# Patient Record
Sex: Male | Born: 1980 | Race: White | Hispanic: No | Marital: Single | Smoking: Never smoker
Health system: Southern US, Community
[De-identification: ages and names within clinical notes are randomized; demographics above are authoritative.]

## PROBLEM LIST (undated history)

## (undated) DIAGNOSIS — K589 Irritable bowel syndrome without diarrhea: Secondary | ICD-10-CM

## (undated) DIAGNOSIS — R350 Frequency of micturition: Secondary | ICD-10-CM

## (undated) DIAGNOSIS — R351 Nocturia: Secondary | ICD-10-CM

## (undated) HISTORY — DX: Irritable bowel syndrome without diarrhea: K58.9

## (undated) HISTORY — DX: Frequency of micturition: R35.0

## (undated) HISTORY — PX: TONSILLECTOMY: SUR1361

## (undated) HISTORY — DX: Nocturia: R35.1

## (undated) HISTORY — PX: HERNIA REPAIR: SHX51

## (undated) HISTORY — PX: TYMPANOSTOMY TUBE PLACEMENT: SHX32

---

## 2011-12-24 ENCOUNTER — Emergency Department (HOSPITAL_COMMUNITY)
Admission: EM | Admit: 2011-12-24 | Discharge: 2011-12-24 | Disposition: A | Discharge status: Home / Self Care | Payer: BC Managed Care – PPO | Attending: Emergency Medicine | Admitting: Emergency Medicine

## 2011-12-24 ENCOUNTER — Emergency Department (HOSPITAL_COMMUNITY): Payer: BC Managed Care – PPO

## 2011-12-24 ENCOUNTER — Encounter (HOSPITAL_COMMUNITY): Payer: Self-pay

## 2011-12-24 DIAGNOSIS — R109 Unspecified abdominal pain: Secondary | ICD-10-CM

## 2011-12-24 LAB — URINALYSIS, ROUTINE W REFLEX MICROSCOPIC
Bilirubin Urine: NEGATIVE
Glucose, UA: NEGATIVE mg/dL
Hgb urine dipstick: NEGATIVE
Ketones, ur: NEGATIVE mg/dL
Leukocytes, UA: NEGATIVE
Nitrite: NEGATIVE
Protein, ur: NEGATIVE mg/dL
Specific Gravity, Urine: 1.023 (ref 1.005–1.030)
Urobilinogen, UA: 0.2 mg/dL (ref 0.0–1.0)
pH: 7 (ref 5.0–8.0)

## 2011-12-24 LAB — CBC WITH DIFFERENTIAL/PLATELET
Basophils Absolute: 0 10*3/uL (ref 0.0–0.1)
Basophils Relative: 1 % (ref 0–1)
Eosinophils Absolute: 0.1 10*3/uL (ref 0.0–0.7)
Eosinophils Relative: 1 % (ref 0–5)
HCT: 43.2 % (ref 39.0–52.0)
Hemoglobin: 15.9 g/dL (ref 13.0–17.0)
Lymphocytes Relative: 34 % (ref 12–46)
Lymphs Abs: 2.5 10*3/uL (ref 0.7–4.0)
MCH: 30.5 pg (ref 26.0–34.0)
MCHC: 36.8 g/dL — ABNORMAL HIGH (ref 30.0–36.0)
MCV: 82.8 fL (ref 78.0–100.0)
Monocytes Absolute: 0.5 10*3/uL (ref 0.1–1.0)
Monocytes Relative: 7 % (ref 3–12)
Neutro Abs: 4.1 10*3/uL (ref 1.7–7.7)
Neutrophils Relative %: 57 % (ref 43–77)
Platelets: 185 10*3/uL (ref 150–400)
RBC: 5.22 MIL/uL (ref 4.22–5.81)
RDW: 12.2 % (ref 11.5–15.5)
WBC: 7.2 10*3/uL (ref 4.0–10.5)

## 2011-12-24 LAB — POCT I-STAT, CHEM 8
BUN: 10 mg/dL (ref 6–23)
Calcium, Ion: 1.24 mmol/L (ref 1.12–1.32)
Chloride: 102 mEq/L (ref 96–112)
HCT: 46 % (ref 39.0–52.0)
Sodium: 141 mEq/L (ref 135–145)

## 2011-12-24 MED ORDER — DICYCLOMINE HCL 10 MG PO CAPS
10.0000 mg | ORAL_CAPSULE | Freq: Once | ORAL | Status: AC
  Administered 2011-12-24: 10 mg via ORAL
  Filled 2011-12-24: qty 1

## 2011-12-24 MED ORDER — OMEPRAZOLE 20 MG PO CPDR: 20.0000 mg | DELAYED_RELEASE_CAPSULE | Freq: Every day | ORAL | Status: AC

## 2011-12-24 NOTE — ED Notes (Signed)
EDP at bedside  

## 2011-12-24 NOTE — ED Provider Notes (Signed)
History     CSN: [id number]  Arrival date & time 12/24/11  1312   First MD Initiated Contact with Patient 12/24/11 1917      Chief Complaint  Patient presents with  . Abdominal Pain    (Consider location/radiation/quality/duration/timing/severity/associated sxs/prior treatment) Patient is a 31 y.o. male presenting with abdominal pain. The history is provided by the patient.  Abdominal Pain The primary symptoms of the illness include abdominal pain. The primary symptoms of the illness do not include fever, shortness of breath, nausea, vomiting, diarrhea or dysuria. The current episode started more than 2 days ago. The problem has not changed since onset. The abdominal pain is located in the RLQ and LLQ. Pain scale: moderate. The abdominal pain is relieved by nothing.  The illness is associated with laxative use. The patient has had a change in bowel habit. Symptoms associated with the illness do not include chills, anorexia, constipation, urgency, hematuria, frequency or back pain.    History reviewed. No pertinent past medical history.  Past Surgical History  Procedure Date  . Hernia repair   . Tonsillectomy     No family history on file.  History  Substance Use Topics  . Smoking status: Never Smoker   . Smokeless tobacco: Not on file  . Alcohol Use: Yes      Review of Systems  Unable to perform ROS Constitutional: Negative for fever and chills.  Respiratory: Negative for cough and shortness of breath.   Cardiovascular: Negative for chest pain and palpitations.  Gastrointestinal: Positive for abdominal pain. Negative for nausea, vomiting, diarrhea, constipation, blood in stool, abdominal distention and anorexia.  Genitourinary: Negative for dysuria, urgency, frequency and hematuria.  Musculoskeletal: Negative for myalgias, back pain and arthralgias.  Skin: Negative for color change and rash.  Neurological: Negative for light-headedness and headaches.  All other  systems reviewed and are negative.    Allergies  Review of patient's allergies indicates no known allergies.  Home Medications   Current Outpatient Rx  Name Route Sig Dispense Refill  . ANIMAL SHAPES WITH C & FA PO CHEW Oral Chew 1 tablet by mouth daily.    Marland Kitchen ALIGN 4 MG PO CAPS Oral Take 1 capsule by mouth daily.    Marland Kitchen OMEPRAZOLE 20 MG PO CPDR Oral Take 1 capsule (20 mg total) by mouth daily. 30 capsule 0    BP 154/98  Pulse 60  Temp 98.9 F (37.2 C) (Oral)  Resp 20  SpO2 100%  Physical Exam  Nursing note and vitals reviewed. Constitutional: He is oriented to person, place, and time. He appears well-developed and well-nourished.  HENT:  Head: Normocephalic and atraumatic.  Eyes: EOM are normal. Pupils are equal, round, and reactive to light.  Cardiovascular: Normal rate and regular rhythm.   Pulmonary/Chest: Effort normal and breath sounds normal. No respiratory distress.  Abdominal: Soft. Bowel sounds are normal. He exhibits no distension. There is tenderness (mild, RLQ).  Genitourinary: Rectum normal.       No impaction  Neurological: He is alert and oriented to person, place, and time.  Skin: Skin is warm and dry.  Psychiatric: He has a normal mood and affect.    ED Course  Procedures (including critical care time)  Labs Reviewed  CBC WITH DIFFERENTIAL - Abnormal; Notable for the following:    MCHC 36.8 (*)     All other components within normal limits  URINALYSIS, ROUTINE W REFLEX MICROSCOPIC  POCT I-STAT, CHEM 8   Dg Abd Acute W/chest  12/24/2011  *RADIOLOGY REPORT*  Clinical Data: Abdominal pain for 1 week, nausea, vomiting, prior hernia surgery  ACUTE ABDOMEN SERIES (ABDOMEN 2 VIEW & CHEST 1 VIEW)  Comparison: None  Findings: Upper normal heart size. Mediastinal contours and pulmonary vascularity normal. Minimal bronchitic changes without infiltrate or effusion. No pneumothorax. Nonobstructive bowel gas pattern. No bowel dilatation, bowel wall thickening or free  intraperitoneal air. Bones unremarkable. No urinary tract calcification. Question bone island left ilium.  IMPRESSION: Minimal bronchitic changes. No acute abdominal findings.  Original Report Authenticated By: Lollie Marrow, M.D.     1. Abdominal pain       MDM  This is a 31 year old male who presents with about a week's worth of abdominal pain. He was seen by his regular doctor at the end of last week, was told that he was constipated by an x-ray, and he had significant stool on the right side. He was told to drink GoLYTELY, to clear his bowels, and the patient states that he then passed yellow, bloody stools; he says he drank yellow Gatorade with this. He continued to have some pain, so started on mag citrate by his PCP, and had not any improvement, and has continued to pass small amounts of liquid stools. He did not eat much about 2 days ago due to decreased appetite, at which point he developed a headache and had vomiting times once, he has not had any other episodes of nausea or vomiting, has had a mildly decreased appetite throughout the week, but has still been drinking well. He did not have any fevers or any others symptoms. He has mild tenderness to the right lower quadrant on exam, rectal exam does not show any signs of impaction. Basic labs were obtained which were unremarkable. An acute abdominal series was also obtained, which does not show any acute findings, and has not had this significant stool burden. He'll the patient's initial pain was likely due to constipation, however the GoLYTELY was successful in clearing out his constipation, however continue use of laxatives are likely contributing to his current pain. Just with the patient stopping use of laxatives, resuming a normal diet, and close followup with his regular doctor for further evaluation. Discussed indications and worsening of his symptoms which are should return. The patient expressed understanding.      Theotis Burrow, MD 12/25/11 [phone]

## 2011-12-24 NOTE — ED Notes (Signed)
Pt presents with generalized abdominal pain x 1 week, pt seen at [facility], was told he was constipated and given multiple laxatives with only "yellow-brown liquid" for results.  +vomiting x 1 episode, pt reports no appetite.

## 2011-12-24 NOTE — ED Notes (Signed)
Pt stated that abdominal pain is worse, denies any nausea nor vomiting, VSS, skin is warm and dry, respiration is even and unlabored.

## 2011-12-24 NOTE — ED Notes (Signed)
Rx x 1, pt and friend voiced understanding to f/u with PCP and return for worsening of sx

## 2011-12-31 NOTE — ED Provider Notes (Signed)
I saw and evaluated the patient, reviewed the resident's note and I agree with the findings and plan.  30yM with abdominal pain. My exam benign. Lungs clear. RRR, w/o murmur. Well appearing. HD stable. Very los suspicion for emergent surgical process. Plan PPi or H2 blocker for possible gerd. Bentyl for spasms. Return precautions discussed. outpt fu.  Raeford Razor, MD 12/31/11 1004

## 2013-07-07 IMAGING — CR DG ABDOMEN ACUTE W/ 1V CHEST
3 series · 3 of 3 positions shown · non-contrast
Comparison: None

CLINICAL DATA: Abdominal pain for 1 week, nausea, vomiting, prior
hernia surgery

ACUTE ABDOMEN SERIES (ABDOMEN 2 VIEW & CHEST 1 VIEW)

[w chest pa]
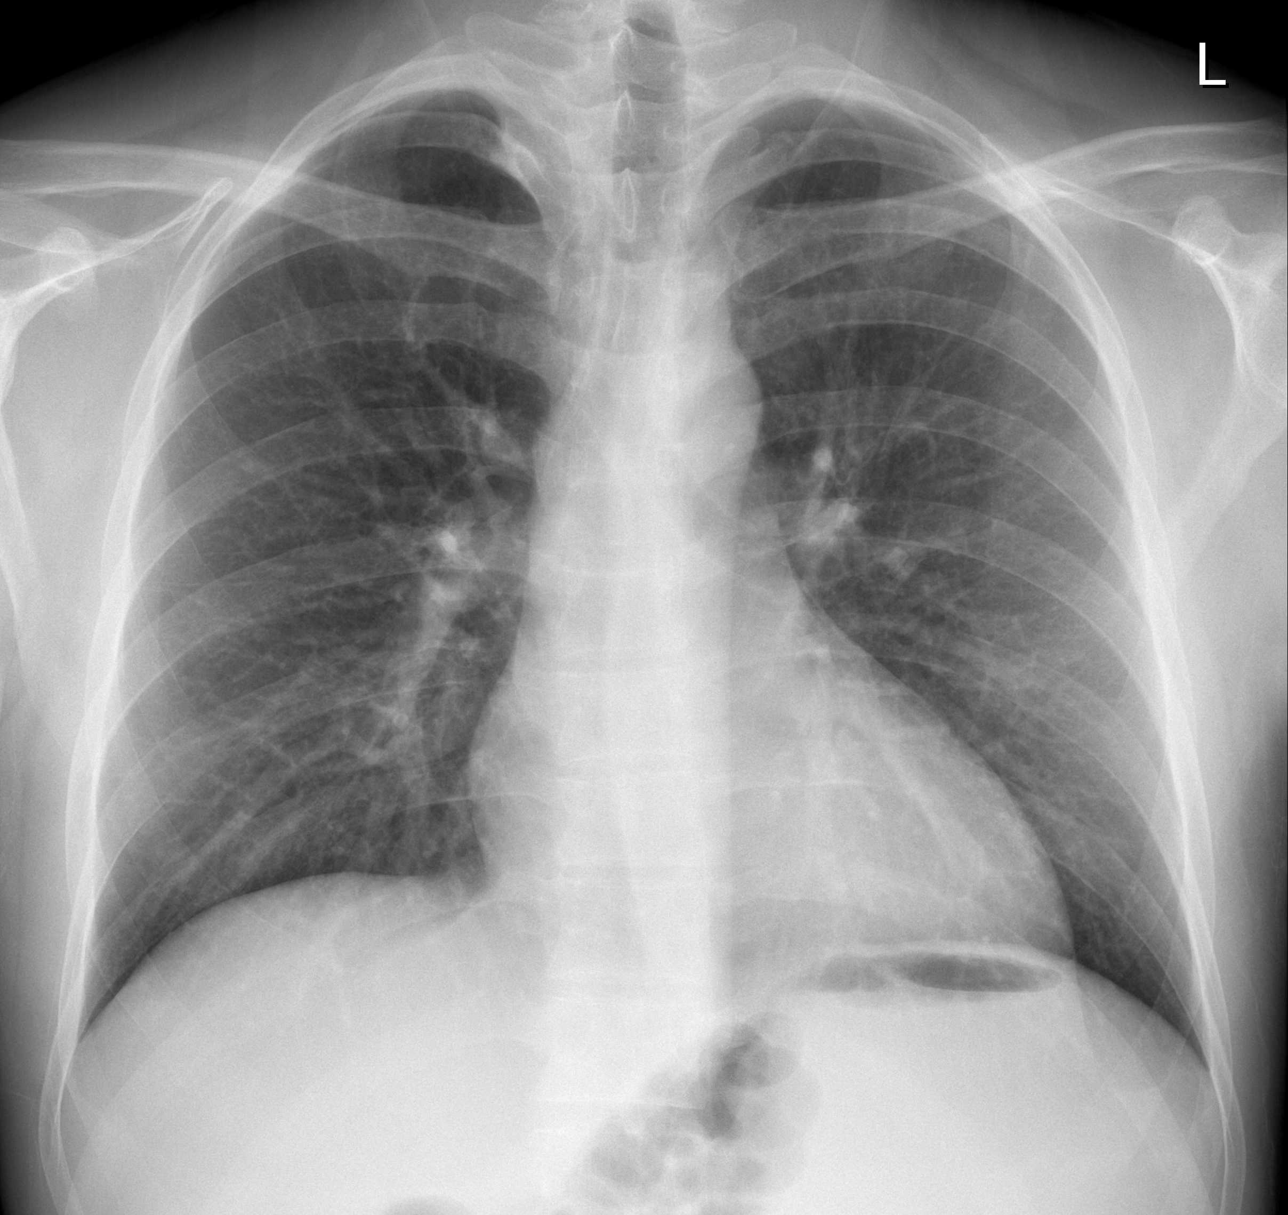

[w abdomen upright]
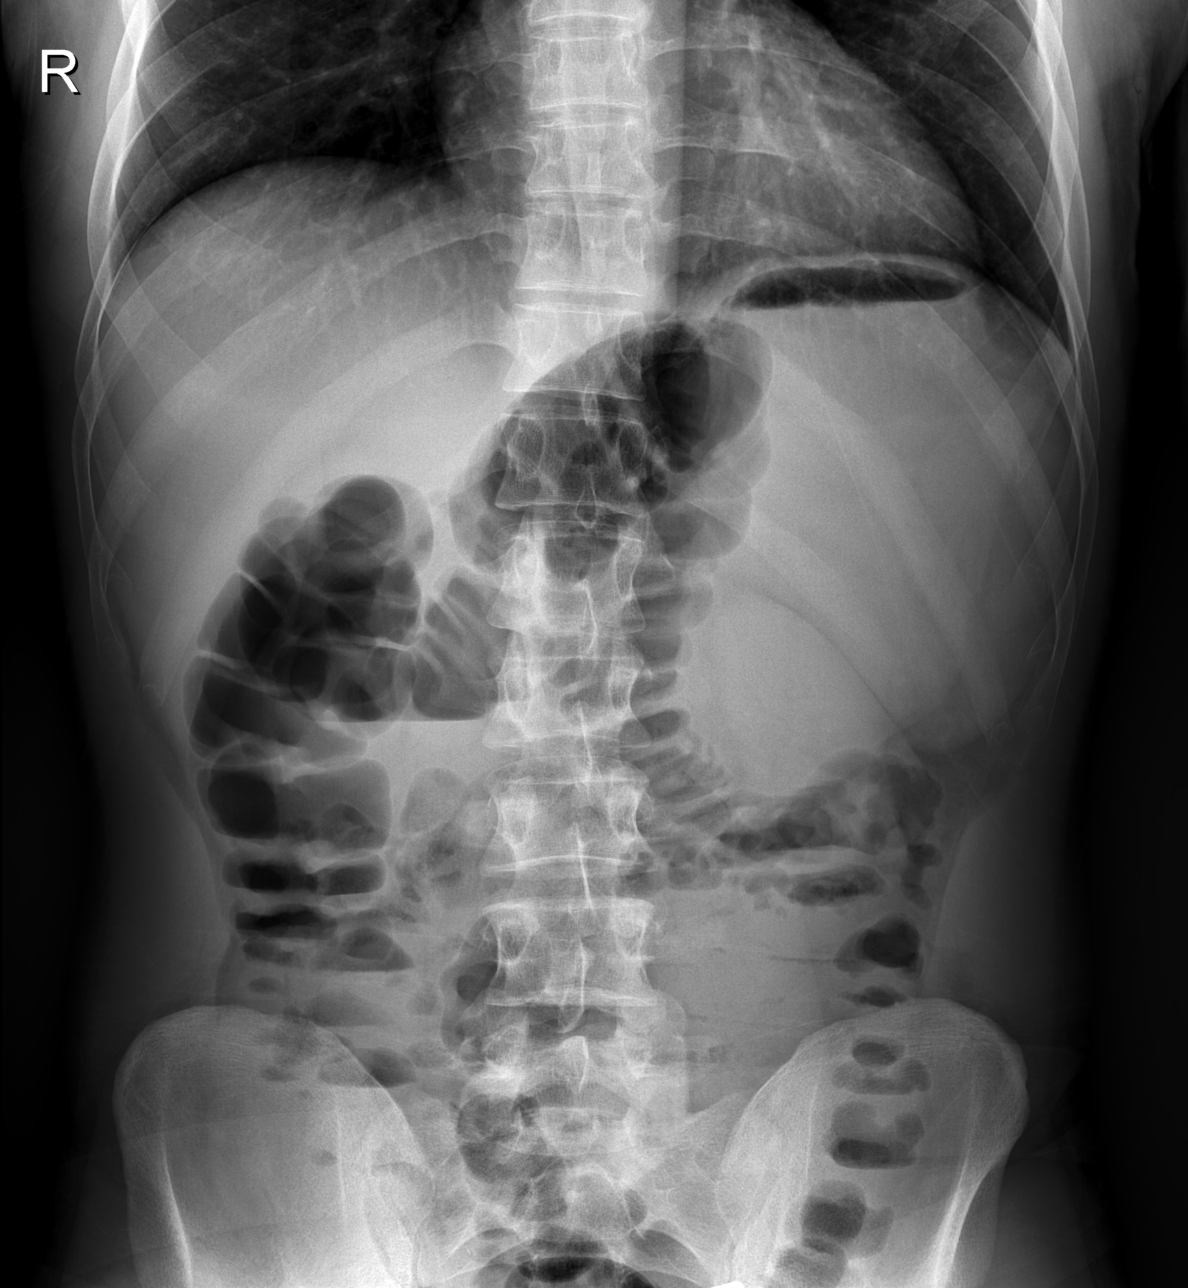

[t abdomen supine]
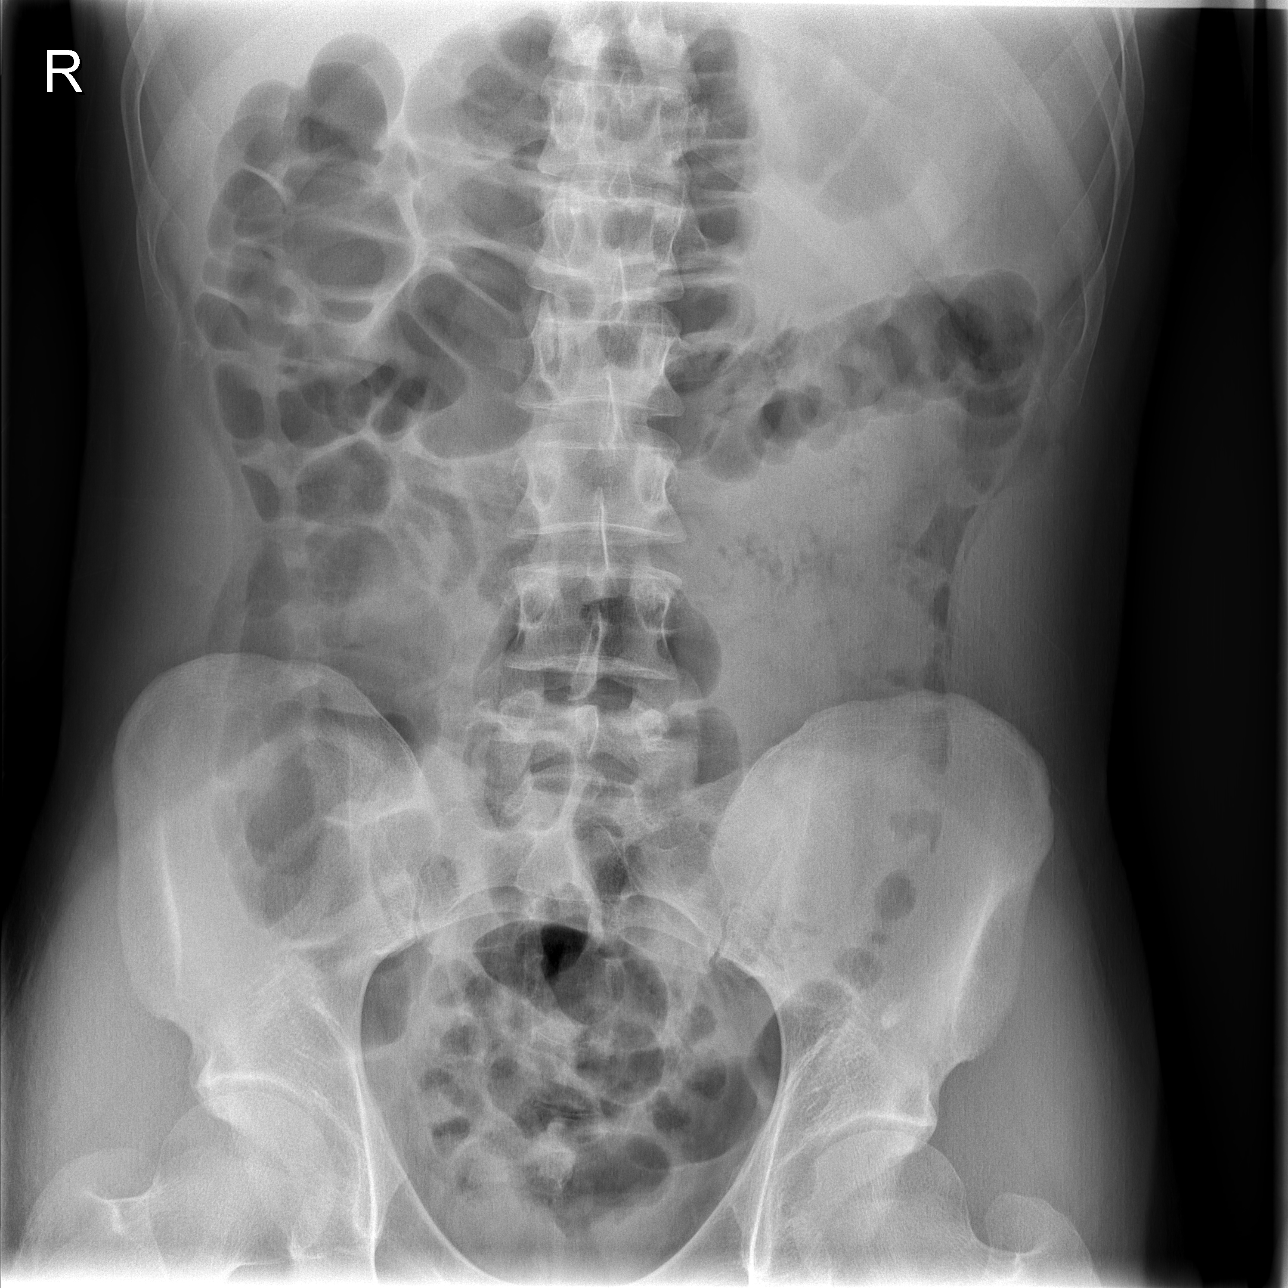

[3 of 3 positions shown; findings below may reference images not displayed]

FINDINGS: Upper normal heart size.
Mediastinal contours and pulmonary vascularity normal.
Minimal bronchitic changes without infiltrate or effusion.
No pneumothorax.
Nonobstructive bowel gas pattern.
No bowel dilatation, bowel wall thickening or free intraperitoneal
air.
Bones unremarkable.
No urinary tract calcification.
Question bone island left ilium.
IMPRESSION: Minimal bronchitic changes.
No acute abdominal findings.

## 2016-07-11 NOTE — Progress Notes (Deleted)
  Tawana ScaleZach Landan Fedie D.O. [facility] [address] [place], [place] [address] Phone: [phone] Subjective:    I'm seeing this patient by the request  of:  Default, Provider, MD   CC: Musculoskeletal chest pain  BJY:[id number]:Subjective  James HoltsMichael Richmond is a 36 y.o. male coming in with complaint of chest discomfort.     Past Medical History:  Diagnosis Date  . IBS (irritable bowel syndrome)   . Nocturia   . Urinary frequency    Past Surgical History:  Procedure Laterality Date  . HERNIA REPAIR    . TONSILLECTOMY    . TYMPANOSTOMY TUBE PLACEMENT     Social History   Social History  . Marital status: Single    Spouse name: N/A  . Number of children: N/A  . Years of education: N/A   Social History Main Topics  . Smoking status: Never Smoker  . Smokeless tobacco: Not on file  . Alcohol use Yes  . Drug use: No  . Sexual activity: Not on file   Other Topics Concern  . Not on file   Social History Narrative  . No narrative on file   No Known Allergies Family History  Problem Relation Age of Onset  . Hypertension Mother   . Hypertension Maternal Grandmother   . Heart attack Paternal Grandfather   . Diabetes Mellitus I Paternal Grandfather     Past medical history, social, surgical and family history all reviewed in electronic medical record.  No pertanent information unless stated regarding to the chief complaint.   Review of Systems:Review of systems updated and as accurate as of 07/11/16  No headache, visual changes, nausea, vomiting, diarrhea, constipation, dizziness, abdominal pain, skin rash, fevers, chills, night sweats, weight loss, swollen lymph nodes, body aches, joint swelling, muscle aches, chest pain, shortness of breath, mood changes.   Objective  There were no vitals taken for this visit. Systems examined below as of 07/11/16   General: No apparent distress alert and oriented x3 mood and affect normal, dressed appropriately.  HEENT: Pupils equal,  extraocular movements intact  Respiratory: Patient's speak in full sentences and does not appear short of breath  Cardiovascular: No lower extremity edema, non tender, no erythema  Skin: Warm dry intact with no signs of infection or rash on extremities or on axial skeleton.  Abdomen: Soft nontender  Neuro: Cranial nerves II through XII are intact, neurovascularly intact in all extremities with 2+ DTRs and 2+ pulses.  Lymph: No lymphadenopathy of posterior or anterior cervical chain or axillae bilaterally.  Gait normal with good balance and coordination.  MSK:  Non tender with full range of motion and good stability and symmetric strength and tone of shoulders, elbows, wrist, hip, knee and ankles bilaterally.     Impression and Recommendations:     This case required medical decision making of moderate complexity.      Note: This dictation was prepared with Dragon dictation along with smaller phrase technology. Any transcriptional errors that result from this process are unintentional.

## 2016-07-12 ENCOUNTER — Emergency Department (HOSPITAL_COMMUNITY)
Admission: EM | Admit: 2016-07-12 | Discharge: 2016-07-13 | Disposition: A | Discharge status: Home / Self Care | Payer: BC Managed Care – PPO | Attending: Emergency Medicine | Admitting: Emergency Medicine

## 2016-07-12 ENCOUNTER — Encounter (HOSPITAL_COMMUNITY): Payer: Self-pay | Admitting: Nurse Practitioner

## 2016-07-12 DIAGNOSIS — J111 Influenza due to unidentified influenza virus with other respiratory manifestations: Secondary | ICD-10-CM | POA: Insufficient documentation

## 2016-07-12 DIAGNOSIS — Z79899 Other long term (current) drug therapy: Secondary | ICD-10-CM | POA: Insufficient documentation

## 2016-07-12 DIAGNOSIS — R112 Nausea with vomiting, unspecified: Secondary | ICD-10-CM | POA: Diagnosis present

## 2016-07-12 DIAGNOSIS — R69 Illness, unspecified: Secondary | ICD-10-CM

## 2016-07-12 LAB — COMPREHENSIVE METABOLIC PANEL
ALT: 19 U/L (ref 17–63)
ANION GAP: 12 (ref 5–15)
AST: 23 U/L (ref 15–41)
Albumin: 4.6 g/dL (ref 3.5–5.0)
Alkaline Phosphatase: 66 U/L (ref 38–126)
BUN: 22 mg/dL — ABNORMAL HIGH (ref 6–20)
CHLORIDE: 98 mmol/L — AB (ref 101–111)
CO2: 26 mmol/L (ref 22–32)
Calcium: 9.2 mg/dL (ref 8.9–10.3)
Creatinine, Ser: 1.24 mg/dL (ref 0.61–1.24)
GFR calc non Af Amer: >60 mL/min (ref >60)
Glucose, Bld: 131 mg/dL — ABNORMAL HIGH (ref 65–99)
POTASSIUM: 3.8 mmol/L (ref 3.5–5.1)
SODIUM: 136 mmol/L (ref 135–145)
Total Bilirubin: 1.3 mg/dL — ABNORMAL HIGH (ref 0.3–1.2)
Total Protein: 7.2 g/dL (ref 6.5–8.1)

## 2016-07-12 LAB — URINALYSIS, ROUTINE W REFLEX MICROSCOPIC
BILIRUBIN URINE: NEGATIVE
Bacteria, UA: NONE SEEN
Glucose, UA: NEGATIVE mg/dL
HGB URINE DIPSTICK: NEGATIVE
KETONES UR: 20 mg/dL — AB
LEUKOCYTES UA: NEGATIVE
Nitrite: NEGATIVE
Protein, ur: 30 mg/dL — AB
Specific Gravity, Urine: 1.033 — ABNORMAL HIGH (ref 1.005–1.030)
pH: 5 (ref 5.0–8.0)

## 2016-07-12 LAB — CBC
HCT: 47.2 % (ref 39.0–52.0)
HEMOGLOBIN: 16.8 g/dL (ref 13.0–17.0)
MCH: 29.4 pg (ref 26.0–34.0)
MCHC: 35.6 g/dL (ref 30.0–36.0)
MCV: 82.5 fL (ref 78.0–100.0)
Platelets: 134 10*3/uL — ABNORMAL LOW (ref 150–400)
RBC: 5.72 MIL/uL (ref 4.22–5.81)
RDW: 12.4 % (ref 11.5–15.5)
WBC: 5.9 10*3/uL (ref 4.0–10.5)

## 2016-07-12 MED ORDER — ONDANSETRON 4 MG PO TBDP: 4.0000 mg | ORAL_TABLET | Freq: Three times a day (TID) | ORAL | 0 refills | Status: AC | PRN

## 2016-07-12 MED ORDER — ONDANSETRON 4 MG PO TBDP
4.0000 mg | ORAL_TABLET | Freq: Once | ORAL | Status: AC | PRN
  Administered 2016-07-12: 4 mg via ORAL

## 2016-07-12 MED ORDER — ONDANSETRON HCL 4 MG/2ML IJ SOLN
4.0000 mg | Freq: Once | INTRAMUSCULAR | Status: AC
  Administered 2016-07-12: 4 mg via INTRAVENOUS
  Filled 2016-07-12: qty 2

## 2016-07-12 MED ORDER — ONDANSETRON 4 MG PO TBDP
ORAL_TABLET | ORAL | Status: AC
  Filled 2016-07-12: qty 1

## 2016-07-12 MED ORDER — SODIUM CHLORIDE 0.9 % IV BOLUS (SEPSIS)
1000.0000 mL | Freq: Once | INTRAVENOUS | Status: AC
  Administered 2016-07-12: 1000 mL via INTRAVENOUS

## 2016-07-12 MED ORDER — IBUPROFEN 800 MG PO TABS: 800.0000 mg | ORAL_TABLET | Freq: Three times a day (TID) | ORAL | 0 refills | Status: AC

## 2016-07-12 NOTE — ED Triage Notes (Signed)
Pt presents with c/o GI problem. The symptoms began around lunchtime today. He reports nausea, vomiting, diarrhea, unable to tolerate any oral intake. [facility] based on his symptoms, instructed to take OTC medications to manage his symptoms at home. The n/v/d began afterwards so he decided to return to ED for revaluation.

## 2016-07-12 NOTE — ED Provider Notes (Signed)
[facility]-EMERGENCY DEPT Provider Note   CSN: [id number] Arrival date & time: 07/12/16  1734     History   Chief Complaint Chief Complaint  Patient presents with  . GI Problem    HPI James Richmond is a 36 y.o. male.  HPI   James Richmond is a 36 y.o. male, with a history of IBS, presenting to the ED with Nausea, vomiting, and diarrhea beginning this afternoon. Patient states he began having congestion, fever, malaise, fatigue, and sore throat 3 days ago. He went to the urgent care this morning and was diagnosed with the flu. He came in this evening because of the development of vomiting and diarrhea. He admits to poor oral intake, including for hydration. Denies cough, shortness of breath, chest pain, abdominal pain, urinary complaints, or any other complaints.    Past Medical History:  Diagnosis Date  . IBS (irritable bowel syndrome)   . Nocturia   . Urinary frequency     There are no active problems to display for this patient.   Past Surgical History:  Procedure Laterality Date  . HERNIA REPAIR    . TONSILLECTOMY    . TYMPANOSTOMY TUBE PLACEMENT         Home Medications    Prior to Admission medications   Medication Sig Start Date End Date Taking? Authorizing Provider  ibuprofen (ADVIL,MOTRIN) 800 MG tablet Take 1 tablet (800 mg total) by mouth 3 (three) times daily. 07/12/16   Shawn C Joy, PA-C  omeprazole (PRILOSEC) 20 MG capsule Take 1 capsule (20 mg total) by mouth daily. 12/24/11 12/23/12  Theotis Burrow, MD  ondansetron (ZOFRAN ODT) 4 MG disintegrating tablet Take 1 tablet (4 mg total) by mouth every 8 (eight) hours as needed for nausea or vomiting. 07/12/16   Anselm Pancoast, PA-C  Pediatric Multiple Vit-C-FA (MULTIVITAMIN ANIMAL SHAPES, WITH CA/FA,) WITH C & FA CHEW Chew 1 tablet by mouth daily.    Historical Provider, MD  Probiotic Product (ALIGN) 4 MG CAPS Take 1 capsule by mouth daily.    Historical Provider, MD    Family History Family History  Problem  Relation Age of Onset  . Hypertension Mother   . Hypertension Maternal Grandmother   . Heart attack Paternal Grandfather   . Diabetes Mellitus I Paternal Grandfather     Social History Social History  Substance Use Topics  . Smoking status: Never Smoker  . Smokeless tobacco: Not on file  . Alcohol use Yes     Allergies   Patient has no known allergies.   Review of Systems Review of Systems  Constitutional: Positive for fever (subjective).  Respiratory: Negative for cough and shortness of breath.   Cardiovascular: Negative for chest pain.  Gastrointestinal: Positive for diarrhea, nausea and vomiting. Negative for abdominal pain and blood in stool.  Genitourinary: Negative for dysuria.  Skin: Negative for rash.  All other systems reviewed and are negative.    Physical Exam Updated Vital Signs BP 116/75 (BP Location: Right Arm)   Pulse 86   Temp 97.6 F (36.4 C) (Oral)   Resp 16   SpO2 98%   Physical Exam  Constitutional: He appears well-developed and well-nourished. No distress.  HENT:  Head: Normocephalic and atraumatic.  Eyes: Conjunctivae are normal.  Neck: Neck supple.  Cardiovascular: Normal rate, regular rhythm, normal heart sounds and intact distal pulses.   Pulmonary/Chest: Effort normal and breath sounds normal. No respiratory distress.  Abdominal: Soft. There is no tenderness. There is no guarding.  Musculoskeletal:  He exhibits no edema.  Lymphadenopathy:    He has no cervical adenopathy.  Neurological: He is alert.  Skin: Skin is warm and dry. He is not diaphoretic.  Psychiatric: He has a normal mood and affect. His behavior is normal.  Nursing note and vitals reviewed.    ED Treatments / Results  Labs (all labs ordered are listed, but only abnormal results are displayed) Labs Reviewed  COMPREHENSIVE METABOLIC PANEL - Abnormal; Notable for the following:       Result Value   Chloride 98 (*)    Glucose, Bld 131 (*)    BUN 22 (*)    Total  Bilirubin 1.3 (*)    All other components within normal limits  CBC - Abnormal; Notable for the following:    Platelets 134 (*)    All other components within normal limits  URINALYSIS, ROUTINE W REFLEX MICROSCOPIC - Abnormal; Notable for the following:    Color, Urine AMBER (*)    APPearance HAZY (*)    Specific Gravity, Urine 1.033 (*)    Ketones, ur 20 (*)    Protein, ur 30 (*)    Squamous Epithelial / LPF 0-5 (*)    All other components within normal limits    EKG  EKG Interpretation None       Radiology No results found.  Procedures Procedures (including critical care time)  Medications Ordered in ED Medications  ondansetron (ZOFRAN-ODT) 4 MG disintegrating tablet (not administered)  ondansetron (ZOFRAN-ODT) disintegrating tablet 4 mg (4 mg Oral Given 07/12/16 1826)  sodium chloride 0.9 % bolus 1,000 mL (0 mLs Intravenous Stopped 07/13/16 0005)  ondansetron (ZOFRAN) injection 4 mg (4 mg Intravenous Given 07/12/16 2316)     Initial Impression / Assessment and Plan / ED Course  I have reviewed the triage vital signs and the nursing notes.  Pertinent labs & imaging results that were available during my care of the patient were reviewed by me and considered in my medical decision making (see chart for details).     Patient presents with symptoms of influenza-like illness. He is nontoxic appearing, has no red flag symptoms, and vital signs are within normal limits. Patient with some dehydration noted on UA. States improvement with IV fluid bolus. Able to pass an oral fluid challenge without difficulty. Home care and return precautions discussed. PCP follow-up. Patient voiced understanding of all instructions and is comfortable with discharge.   Vitals:   07/12/16 2245 07/12/16 2330 07/12/16 2345 07/13/16 0000  BP: 116/79 124/85 144/93 134/87  Pulse: 71 69 73 76  Resp:      Temp:      TempSrc:      SpO2: 98% 99% 98% 97%     Final Clinical Impressions(s) / ED  Diagnoses   Final diagnoses:  Influenza-like illness    New Prescriptions New Prescriptions   IBUPROFEN (ADVIL,MOTRIN) 800 MG TABLET    Take 1 tablet (800 mg total) by mouth 3 (three) times daily.   ONDANSETRON (ZOFRAN ODT) 4 MG DISINTEGRATING TABLET    Take 1 tablet (4 mg total) by mouth every 8 (eight) hours as needed for nausea or vomiting.     Anselm PancoastShawn C Joy, PA-C 07/13/16 0013    Blane OharaJoshua Zavitz, MD 07/14/16 [phone]

## 2016-07-13 NOTE — ED Notes (Signed)
Passed fluid challenge 

## 2016-07-13 NOTE — Discharge Instructions (Signed)
There were signs of dehydration on the labs this evening. Your symptoms are consistent with a viral illness. Viruses do not require antibiotics. Treatment is symptomatic care and it is important to note that these symptoms may last for 7-14 days. Symptoms will be intensified and complicated by dehydration. Dehydration can also extend the duration of symptoms. Drink plenty of fluids and get plenty of rest. You should be drinking at least half a liter of water an hour to stay hydrated. Electrolyte drinks are also encouraged. You should be drinking enough fluids to make your urine light yellow, almost clear. If this is not the case, you are not drinking enough water. Ibuprofen, Naproxen, or Tylenol for pain or fever. Zofran for nausea. Mucinex to relieve nasal and chest congestion. Saline sinus rinses and saline nasal sprays may also help relieve congestion. Warm liquids or Chloraseptic spray may help soothe a sore throat. Follow up with a primary care provider, as needed, for any future management of this issue.

## 2016-07-13 NOTE — ED Notes (Signed)
Pt understood dc material. NAD noted. Scripts given at dc 

## 2016-07-14 ENCOUNTER — Ambulatory Visit: Payer: BC Managed Care – PPO | Admitting: Family Medicine
